# Patient Record
Sex: Male | Born: 1980 | Race: Black or African American | Hispanic: No | Marital: Single
Health system: Southern US, Community
[De-identification: ages and names within clinical notes are randomized; demographics above are authoritative.]

---

## 2020-01-18 ENCOUNTER — Other Ambulatory Visit: Payer: Self-pay

## 2020-01-18 ENCOUNTER — Emergency Department (HOSPITAL_COMMUNITY)
Admission: EM | Admit: 2020-01-18 | Discharge: 2020-01-18 | Disposition: A | Discharge status: Home / Self Care | Payer: BC Managed Care – PPO | Attending: Emergency Medicine | Admitting: Emergency Medicine

## 2020-01-18 ENCOUNTER — Emergency Department (HOSPITAL_COMMUNITY): Payer: BC Managed Care – PPO

## 2020-01-18 ENCOUNTER — Encounter (HOSPITAL_COMMUNITY): Payer: Self-pay

## 2020-01-18 DIAGNOSIS — J069 Acute upper respiratory infection, unspecified: Secondary | ICD-10-CM | POA: Diagnosis not present

## 2020-01-18 DIAGNOSIS — R059 Cough, unspecified: Secondary | ICD-10-CM | POA: Diagnosis present

## 2020-01-18 DIAGNOSIS — Z20822 Contact with and (suspected) exposure to covid-19: Secondary | ICD-10-CM | POA: Diagnosis not present

## 2020-01-18 LAB — RESPIRATORY PANEL BY RT PCR (FLU A&B, COVID)
Influenza A by PCR: NEGATIVE
Influenza B by PCR: NEGATIVE
SARS Coronavirus 2 by RT PCR: NEGATIVE

## 2020-01-18 MED ORDER — ALBUTEROL SULFATE HFA 108 (90 BASE) MCG/ACT IN AERS
4.0000 | INHALATION_SPRAY | Freq: Once | RESPIRATORY_TRACT | Status: AC
  Administered 2020-01-18: 4 via RESPIRATORY_TRACT
  Filled 2020-01-18: qty 6.7

## 2020-01-18 NOTE — ED Provider Notes (Signed)
[facility] Provider Note   CSN: [id number] Arrival date & time: 01/18/20  1302     History Chief Complaint  Patient presents with  . Cough  . Nasal Congestion    Blake Morris is a 39 y.o. male with no pertinent past medical history that presents the emergency department today for URI symptoms.  Patient states that he started developing a cough last Friday, states that over the weekend it worsened after he smoked tobacco.  Also admits to congestion.  Patient states that he feels like there is mucus in his lungs and wants a chest x-ray to make sure that he does not have pneumonia.  Patient states that he also heard some wheezing this morning, states that mucus has been getting thinner and easier to spit up.  Patient states that his symptoms have been getting better, however want to come here to get checked out.  Denies any shortness of breath or chest pain.  Cough is productive.  Has not been vaccinated against Covid.  Denies any fevers, chills, nausea, vomiting, abdominal pain, headache, myalgias.  Denies any sore throat.  Denies any sick contacts.  States that he is generally healthy, does not take any medications daily.  HPI     History reviewed. No pertinent past medical history.  There are no problems to display for this patient.   History reviewed. No pertinent surgical history.     History reviewed. No pertinent family history.  Social History   Tobacco Use  . Smoking status: Not on file  Substance Use Topics  . Alcohol use: Not on file  . Drug use: Not on file    Home Medications Prior to Admission medications   Not on File    Allergies    Percocet [oxycodone-acetaminophen]  Review of Systems   Review of Systems  Constitutional: Negative for diaphoresis, fatigue and fever.  Eyes: Negative for visual disturbance.  Respiratory: Positive for cough. Negative for chest tightness and shortness of breath.   Cardiovascular:  Negative for chest pain.  Gastrointestinal: Negative for nausea and vomiting.  Musculoskeletal: Negative for back pain and myalgias.  Skin: Negative for color change, pallor, rash and wound.  Neurological: Negative for syncope, weakness, light-headedness, numbness and headaches.  Psychiatric/Behavioral: Negative for behavioral problems and confusion.    Physical Exam Updated Vital Signs BP 139/86   Pulse 80   Temp 98.7 F (37.1 C) (Oral)   Resp 18   Ht 6' 2.5" (1.892 m)   Wt 99.8 kg   SpO2 99%   BMI 27.87 kg/m   Physical Exam Constitutional:      General: He is not in acute distress.    Appearance: Normal appearance. He is not ill-appearing, toxic-appearing or diaphoretic.     Comments: Patient without acute respiratory stress.  Patient is sitting comfortably in bed, no tripoding, use of accessory muscles.  Patient is speaking to me in full sentences.  Handling secretions well.  HENT:     Head: Normocephalic and atraumatic.     Jaw: There is normal jaw occlusion. No trismus, swelling or malocclusion.     Nose: Congestion present. No rhinorrhea.     Right Sinus: No maxillary sinus tenderness or frontal sinus tenderness.     Left Sinus: No maxillary sinus tenderness or frontal sinus tenderness.     Mouth/Throat:     Mouth: Mucous membranes are moist. No oral lesions.     Dentition: Normal dentition.     Tongue: No lesions.  Palate: No mass and lesions.     Pharynx: Oropharynx is clear. Uvula midline. No pharyngeal swelling, oropharyngeal exudate, posterior oropharyngeal erythema or uvula swelling.     Tonsils: No tonsillar exudate or tonsillar abscesses. 1+ on the right. 1+ on the left.     Comments: Patient without tonsillar enlargement or exudate.  No signs of peritonsillar abscess, palate without any tenderness or masses palpated.  No swelling under the tongue, uvula is midline without any inflammation. Eyes:     General: No visual field deficit or scleral icterus.        Right eye: No discharge.        Left eye: No discharge.     Extraocular Movements: Extraocular movements intact.     Conjunctiva/sclera: Conjunctivae normal.     Pupils: Pupils are equal, round, and reactive to light.  Cardiovascular:     Rate and Rhythm: Normal rate and regular rhythm.     Pulses: Normal pulses.     Heart sounds: Normal heart sounds. No murmur heard.  No friction rub. No gallop.   Pulmonary:     Effort: Pulmonary effort is normal. No respiratory distress.     Breath sounds: No stridor. Wheezing present. No rhonchi or rales.  Chest:     Chest wall: No tenderness.  Abdominal:     General: Abdomen is flat. Bowel sounds are normal. There is no distension.     Palpations: Abdomen is soft.     Tenderness: There is no abdominal tenderness. There is no right CVA tenderness, left CVA tenderness, guarding or rebound.  Musculoskeletal:        General: No swelling or tenderness. Normal range of motion.     Cervical back: Normal range of motion and neck supple. No rigidity or tenderness.     Right lower leg: No edema.     Left lower leg: No edema.  Lymphadenopathy:     Cervical: No cervical adenopathy.  Skin:    General: Skin is warm and dry.     Capillary Refill: Capillary refill takes less than 2 seconds.     Coloration: Skin is not pale.     Findings: No erythema or rash.  Neurological:     General: No focal deficit present.     Mental Status: He is alert and oriented to person, place, and time.     Cranial Nerves: Cranial nerves are intact. No cranial nerve deficit or facial asymmetry.     Motor: Motor function is intact. No weakness.     Coordination: Coordination is intact.     Gait: Gait is intact. Gait normal.  Psychiatric:        Mood and Affect: Mood normal.        Behavior: Behavior normal.     ED Results / Procedures / Treatments   Labs (all labs ordered are listed, but only abnormal results are displayed) Labs Reviewed  RESPIRATORY PANEL BY RT PCR  (FLU A&B, COVID)    EKG None  Radiology DG Chest 1 View  Result Date: 01/18/2020 CLINICAL DATA:  39 year old male with shortness of breath and cough. EXAM: CHEST  1 VIEW COMPARISON:  None. FINDINGS: The heart size and mediastinal contours are within normal limits. Both lungs are clear. The visualized skeletal structures are unremarkable. IMPRESSION: No active disease. Electronically Signed   By: Elgie Collard M.D.   On: 01/18/2020 17:26    Procedures Procedures (including critical care time)  Medications Ordered in ED Medications  albuterol (VENTOLIN  HFA) 108 (90 Base) MCG/ACT inhaler 4 puff (4 puffs Inhalation Given 01/18/20 1731)    ED Course  I have reviewed the triage vital signs and the nursing notes.  Pertinent labs & imaging results that were available during my care of the patient were reviewed by me and considered in my medical decision making (see chart for details).    MDM Rules/Calculators/A&P                           Blake Morris is a 39 y.o. male with no pertinent past medical history that presents the emergency department today for URI symptoms.  Patient appears well, physical exam benign.  Mild expiratory wheezes heard throughout lungs, will give albuterol and reevaluate.  Chest x-ray interpreted by me without any acute cardiopulmonary disease, no signs of pneumonia.  Shared decision-making about blood work, we decided that this is not necessary at this time since patient appears well, normal vital signs.  Patient most likely has upper respiratory infection, bronchitis likely.  Patient to follow-up with primary care, symptomatic treatment discussed including albuterol, Mucinex and Flonase.  Patient expressed understanding.  Vital signs normal.  Patient to be discharged.  Doubt need for further emergent work up at this time. I explained the diagnosis and have given explicit precautions to return to the ER including for any other new or worsening symptoms. The  patient understands and accepts the medical plan as it's been dictated and I have answered their questions. Discharge instructions concerning home care and prescriptions have been given. The patient is STABLE and is discharged to home in good condition.   Final Clinical Impression(s) / ED Diagnoses Final diagnoses:  Viral upper respiratory tract infection    Rx / DC Orders ED Discharge Orders    None       Farrel Gordon, PA-C 01/18/20 1751    Derwood Kaplan, MD 01/18/20 1755

## 2020-01-18 NOTE — Discharge Instructions (Signed)
Your chest x-ray was reassuring, no signs of pneumonia.  You most likely have bronchitis as we discussed.  You can take the albuterol inhaler as needed, you can take up to 2 puffs every 6 hours.  Also you can use Mucinex and Flonase as we discussed.  Tylenol as directed on the bottle for pain.  If you have any new or worsening concerning symptoms please come back to the emergency department.  Please use the attached instructions.  Your Covid test flu test and RSV are pending, if your Covid test is positive please follow CDC guidelines and isolate.  Please follow-up with your primary care in the next couple of days, if you do not have one you can go to [facility].

## 2020-01-18 NOTE — ED Notes (Signed)
An After Visit Summary was printed and given to the patient. Discharge instructions given and no further questions at this time.  

## 2020-01-18 NOTE — ED Triage Notes (Signed)
Pt reports he developed a cough and congestion over the weekend. Pt reports that he feels like the "mucus is in his lungs" and is worried that he has developed pneumonia. Pt reports hearing some mild wheezing when he woke up this morning.

## 2021-05-08 IMAGING — DX DG CHEST 1V
1 series · 1 of 1 positions shown · non-contrast
Comparison: None.

CLINICAL DATA: 39-year-old male with shortness of breath and cough.

EXAM:
CHEST  1 VIEW

[chest ap]
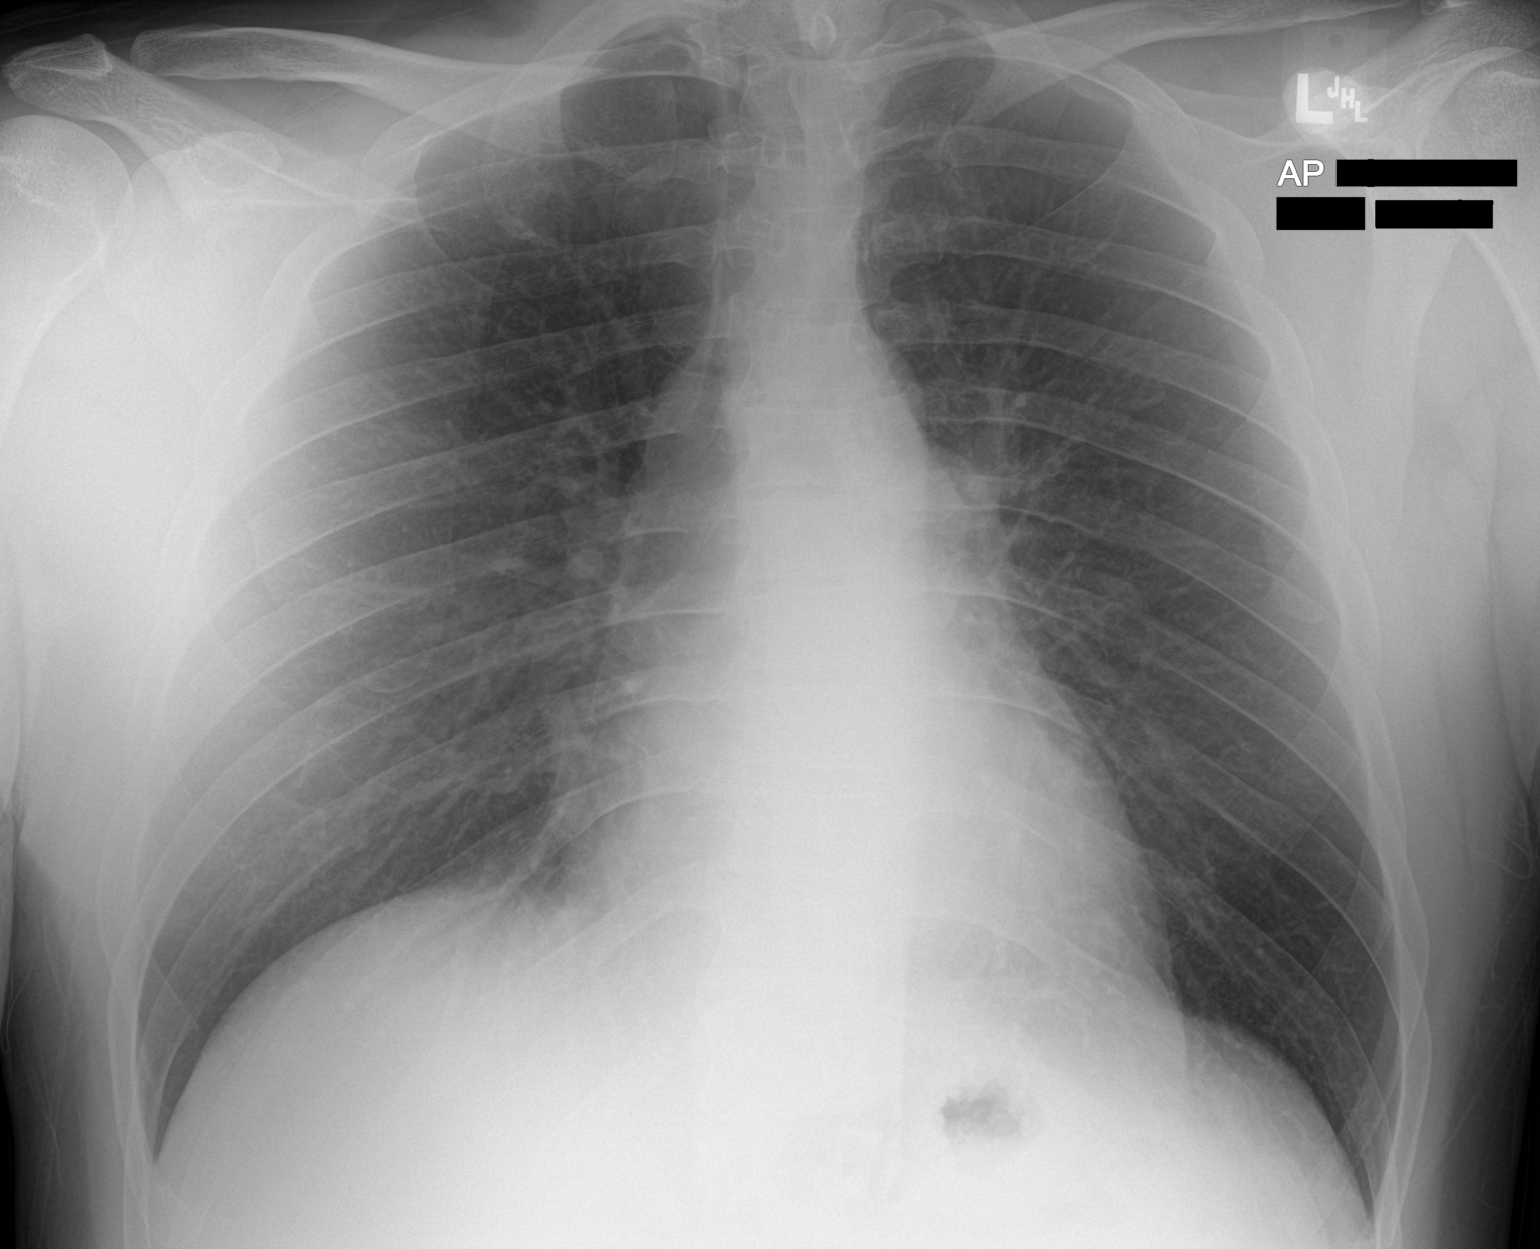

[1 of 1 positions shown; findings below may reference images not displayed]

FINDINGS: The heart size and mediastinal contours are within normal limits.
Both lungs are clear. The visualized skeletal structures are
unremarkable.
IMPRESSION: No active disease.
